# Patient Record
Sex: Male | Born: 1990 | Race: White | Hispanic: No | Marital: Single
Health system: Southern US, Community
[De-identification: ages and names within clinical notes are randomized; demographics above are authoritative.]

---
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
## 2012-05-29 IMAGING — CT CT HEAD W/O CM
1 series · 16 of 30 positions shown, 20 images · non-contrast
Comparison: None.

CLINICAL DATA: Right-sided facial numbness.  Possible right sided
face drooping.

CT HEAD WITHOUT CONTRAST
TECHNIQUE: Contiguous axial images were obtained from the base of
the skull through the vertex without contrast

[Series 2: headseq 4.8 h37s · axial · 0.47mm/px · z∈[+70,+257]mm · 16 of 42 slices shown, 20 images]
[im 2/42  brain]
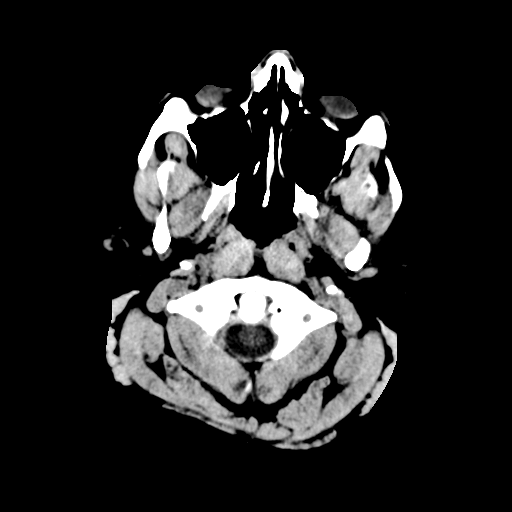
[im 2/42  bone]
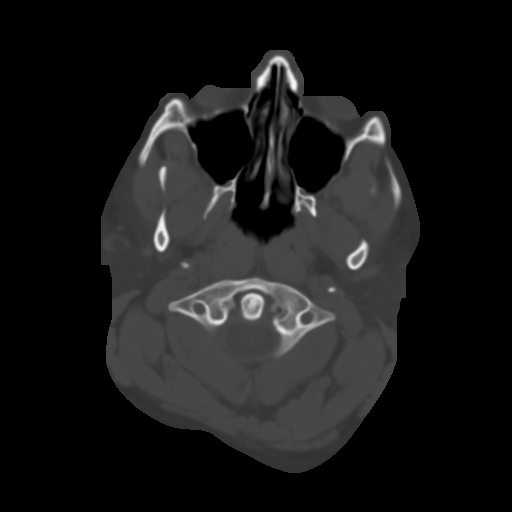
[im 5/42  brain]
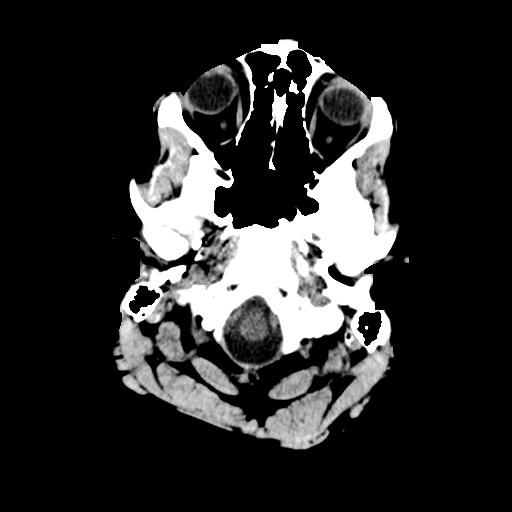
[im 8/42  brain]
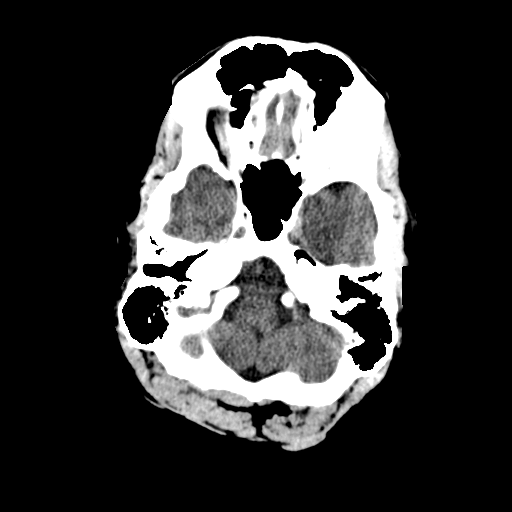
[im 10/42  brain]
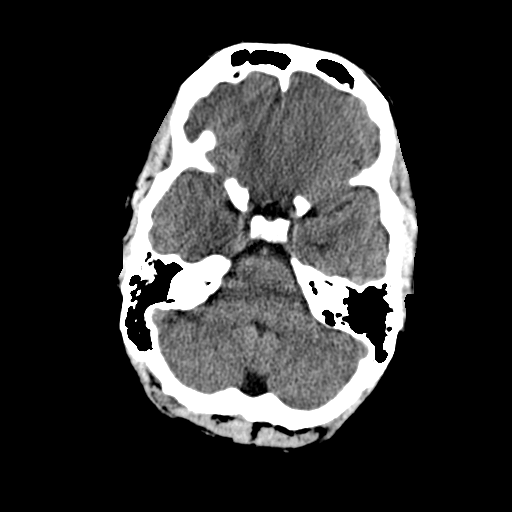
[im 12/42  brain]
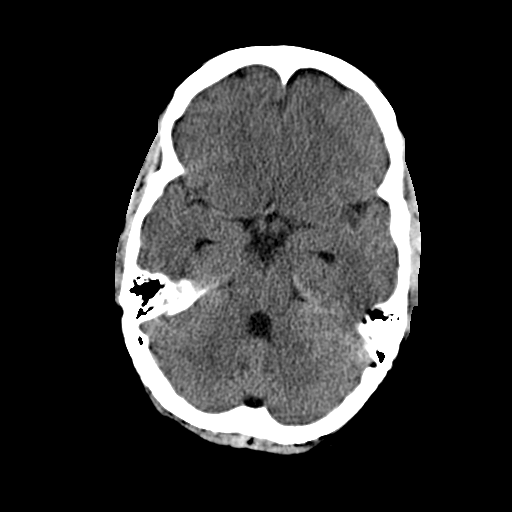
[im 12/42  bone]
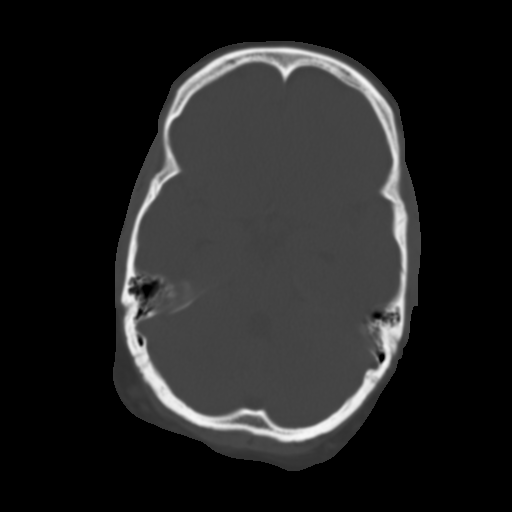
[im 15/42  brain]
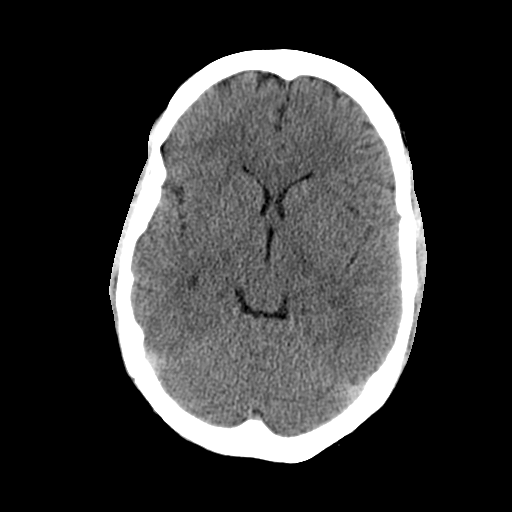
[im 17/42  brain]
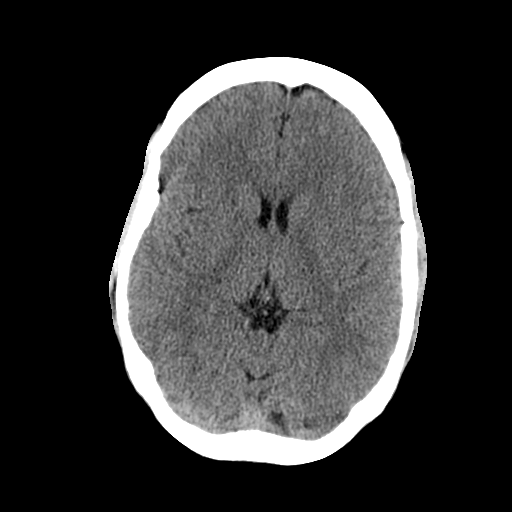
[im 20/42  brain]
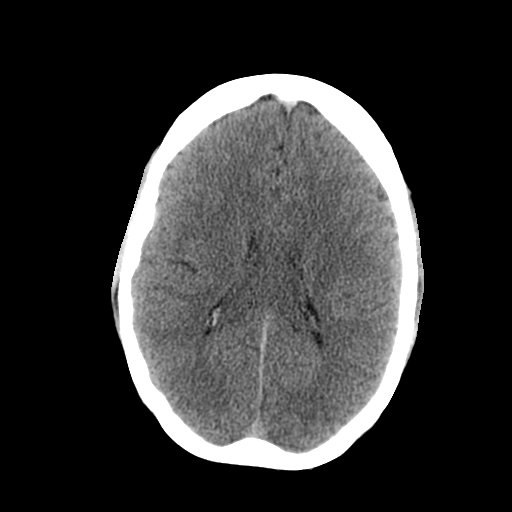
[im 22/42  brain]
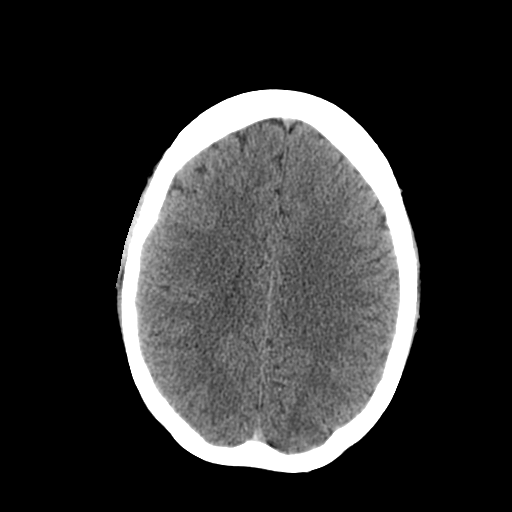
[im 22/42  bone]
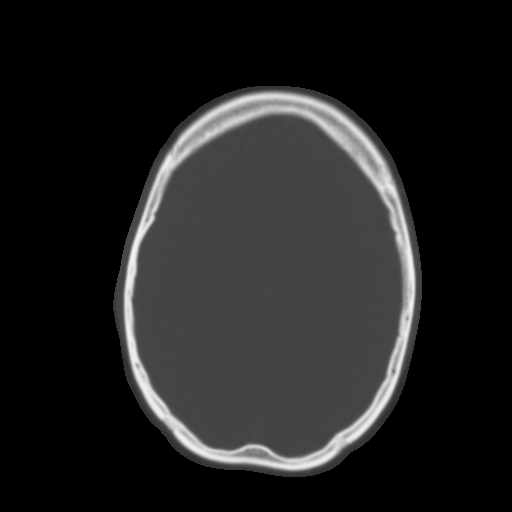
[im 25/42  brain]
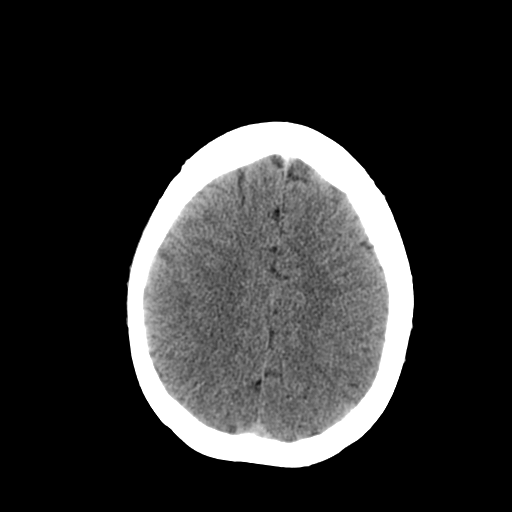
[im 27/42  brain]
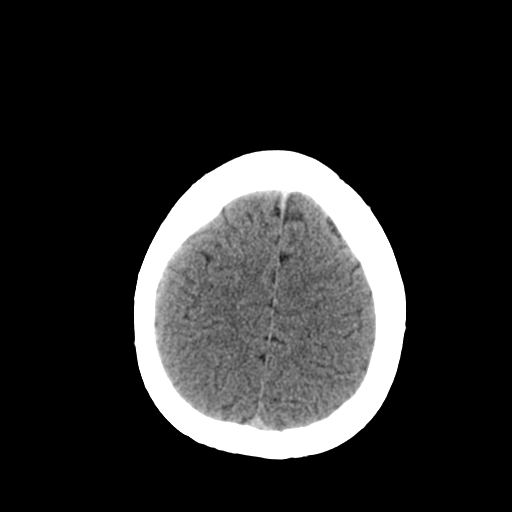
[im 30/42  brain]
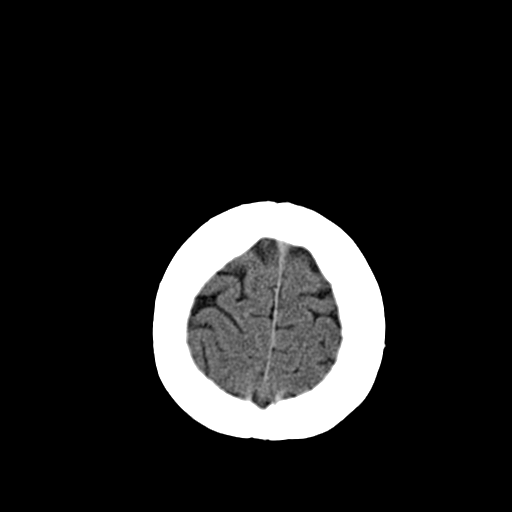
[im 32/42  brain]
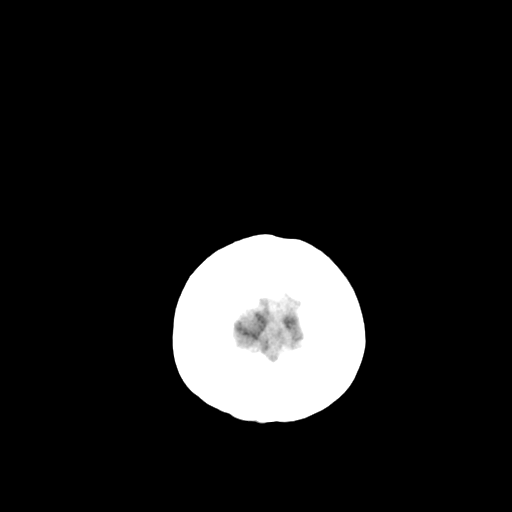
[im 32/42  bone]
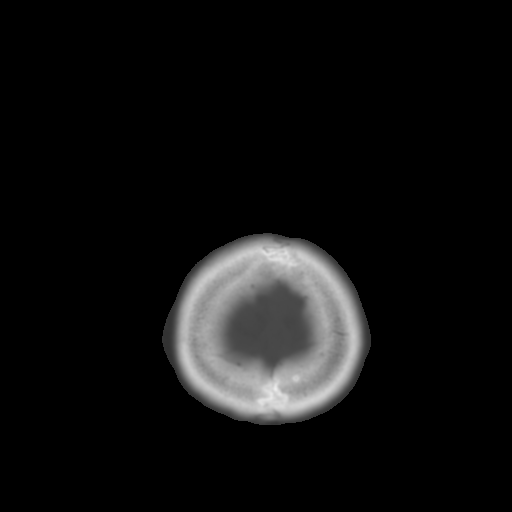
[im 34/42  brain]
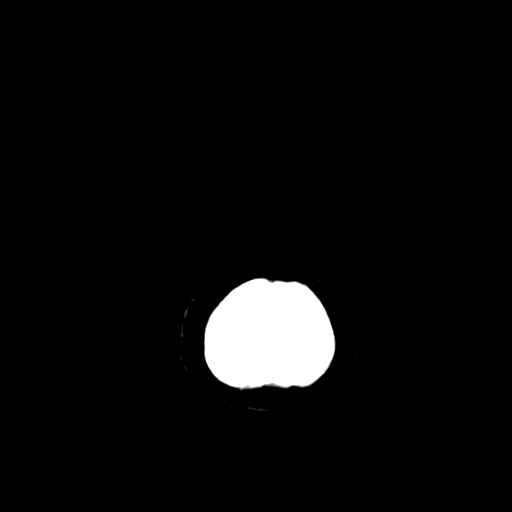
[im 37/42  brain]
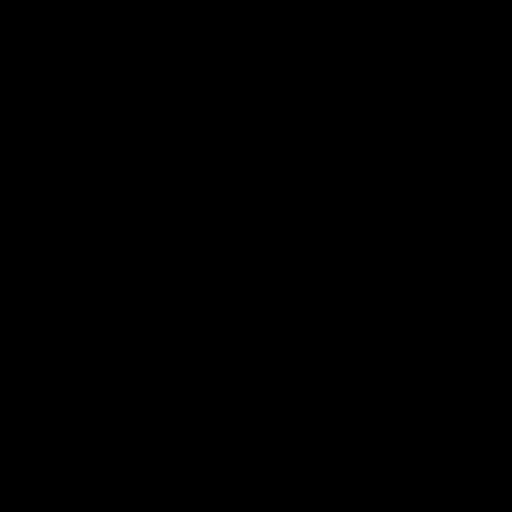
[im 40/42  brain]
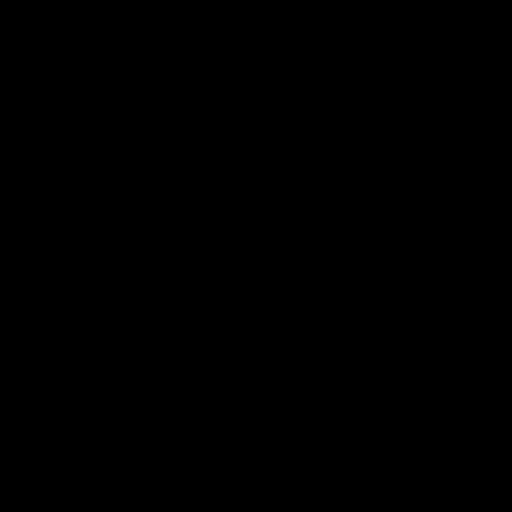

[16 of 30 positions shown; findings below may reference images not displayed]

FINDINGS: The brain has a normal appearance without evidence for
hemorrhage, acute infarction, hydrocephalus, or mass lesion.  There
is no extra axial fluid collection.  The skull and paranasal
sinuses are normal.
IMPRESSION: Normal CT of the head without contrast.

## 2021-10-27 ENCOUNTER — Other Ambulatory Visit: Payer: Self-pay | Admitting: *Deleted

## 2021-10-27 NOTE — Patient Outreach (Signed)
  Care Coordination   10/27/2021 Name: Cody Vang MRN: [id number] DOB: 1990-11-24   Care Coordination Outreach Attempts:  An unsuccessful telephone outreach was attempted today to offer the patient information about available care coordination services as a benefit of their health plan.   Follow Up Plan:  Additional outreach attempts will be made to offer the patient care coordination information and services.   Encounter Outcome:  No Answer  Care Coordination Interventions Activated:  No   Care Coordination Interventions:  No, not indicated    Kemper Durie, RN, MSN, [facility] Care Coordinator [phone]

## 2021-11-01 ENCOUNTER — Other Ambulatory Visit: Payer: Self-pay | Admitting: *Deleted

## 2021-11-01 NOTE — Patient Outreach (Signed)
  Care Coordination   11/01/2021 Name: [place] F Erker MRN: [id number] DOB: 10-Apr-1990   Care Coordination Outreach Attempts:  A second unsuccessful outreach was attempted today to offer the patient with information about available care coordination services as a benefit of their health plan.     Follow Up Plan:  Additional outreach attempts will be made to offer the patient care coordination information and services.   Encounter Outcome:  No Answer  Care Coordination Interventions Activated:  No   Care Coordination Interventions:  No, not indicated    Kemper Durie, RN, MSN, [facility] Care Coordinator [phone]

## 2021-11-08 ENCOUNTER — Other Ambulatory Visit: Payer: Self-pay | Admitting: *Deleted

## 2021-11-08 NOTE — Patient Outreach (Signed)
  Care Coordination   11/08/2021 Name: [place] F Nold MRN: [id number] DOB: [birth year]   Care Coordination Outreach Attempts:  A third unsuccessful outreach was attempted today to offer the patient with information about available care coordination services as a benefit of their health plan.   Follow Up Plan:  No further outreach attempts will be made at this time. We have been unable to contact the patient to offer or enroll patient in care coordination services  Encounter Outcome:  No Answer  Care Coordination Interventions Activated:  No   Care Coordination Interventions:  No, not indicated    Kemper Durie, RN, MSN, [facility] Care Coordinator [phone]

## 2023-01-03 DIAGNOSIS — Z7689 Persons encountering health services in other specified circumstances: Secondary | ICD-10-CM | POA: Diagnosis not present

## 2023-02-27 DIAGNOSIS — R7301 Impaired fasting glucose: Secondary | ICD-10-CM | POA: Diagnosis not present

## 2023-02-27 DIAGNOSIS — E782 Mixed hyperlipidemia: Secondary | ICD-10-CM | POA: Diagnosis not present

## 2023-03-05 DIAGNOSIS — E785 Hyperlipidemia, unspecified: Secondary | ICD-10-CM | POA: Diagnosis not present

## 2023-03-05 DIAGNOSIS — Z0001 Encounter for general adult medical examination with abnormal findings: Secondary | ICD-10-CM | POA: Diagnosis not present
